# Patient Record
Sex: Female | Born: 1990 | Race: White | Hispanic: No | Marital: Single | Smoking: Never smoker
Health system: Southern US, Community
[De-identification: ages and names within clinical notes are randomized; demographics above are authoritative.]

## PROBLEM LIST (undated history)

## (undated) DIAGNOSIS — R569 Unspecified convulsions: Secondary | ICD-10-CM

---

## 2016-08-20 ENCOUNTER — Emergency Department (HOSPITAL_COMMUNITY)
Admission: EM | Admit: 2016-08-20 | Discharge: 2016-08-20 | Disposition: A | Discharge status: Home / Self Care | Payer: BLUE CROSS/BLUE SHIELD | Attending: Emergency Medicine | Admitting: Emergency Medicine

## 2016-08-20 ENCOUNTER — Encounter (HOSPITAL_COMMUNITY): Payer: Self-pay | Admitting: *Deleted

## 2016-08-20 DIAGNOSIS — R569 Unspecified convulsions: Secondary | ICD-10-CM

## 2016-08-20 DIAGNOSIS — Z79899 Other long term (current) drug therapy: Secondary | ICD-10-CM | POA: Diagnosis not present

## 2016-08-20 DIAGNOSIS — S01552A Open bite of oral cavity, initial encounter: Secondary | ICD-10-CM | POA: Diagnosis present

## 2016-08-20 DIAGNOSIS — Y999 Unspecified external cause status: Secondary | ICD-10-CM | POA: Insufficient documentation

## 2016-08-20 DIAGNOSIS — Y939 Activity, unspecified: Secondary | ICD-10-CM | POA: Diagnosis not present

## 2016-08-20 DIAGNOSIS — X58XXXA Exposure to other specified factors, initial encounter: Secondary | ICD-10-CM | POA: Insufficient documentation

## 2016-08-20 DIAGNOSIS — S01502A Unspecified open wound of oral cavity, initial encounter: Secondary | ICD-10-CM | POA: Insufficient documentation

## 2016-08-20 DIAGNOSIS — G40909 Epilepsy, unspecified, not intractable, without status epilepticus: Secondary | ICD-10-CM | POA: Insufficient documentation

## 2016-08-20 DIAGNOSIS — Y929 Unspecified place or not applicable: Secondary | ICD-10-CM | POA: Diagnosis not present

## 2016-08-20 HISTORY — DX: Unspecified convulsions: R56.9

## 2016-08-20 LAB — BASIC METABOLIC PANEL
Anion gap: 6 (ref 5–15)
BUN: 5 mg/dL — AB (ref 6–20)
CHLORIDE: 106 mmol/L (ref 101–111)
CO2: 25 mmol/L (ref 22–32)
Calcium: 9.1 mg/dL (ref 8.9–10.3)
Creatinine, Ser: 0.71 mg/dL (ref 0.44–1.00)
GFR calc Af Amer: >60 mL/min (ref >60)
GFR calc non Af Amer: >60 mL/min (ref >60)
Glucose, Bld: 92 mg/dL (ref 65–99)
POTASSIUM: 3.9 mmol/L (ref 3.5–5.1)
SODIUM: 137 mmol/L (ref 135–145)

## 2016-08-20 LAB — CBC WITH DIFFERENTIAL/PLATELET
Basophils Absolute: 0 10*3/uL (ref 0.0–0.1)
Basophils Relative: 0 %
Eosinophils Absolute: 0 10*3/uL (ref 0.0–0.7)
Eosinophils Relative: 1 %
HCT: 41.7 % (ref 36.0–46.0)
HEMOGLOBIN: 13.8 g/dL (ref 12.0–15.0)
LYMPHS ABS: 2.3 10*3/uL (ref 0.7–4.0)
LYMPHS PCT: 29 %
MCH: 31.5 pg (ref 26.0–34.0)
MCHC: 33.1 g/dL (ref 30.0–36.0)
MCV: 95.2 fL (ref 78.0–100.0)
MONOS PCT: 7 %
Monocytes Absolute: 0.5 10*3/uL (ref 0.1–1.0)
NEUTROS PCT: 63 %
Neutro Abs: 5.1 10*3/uL (ref 1.7–7.7)
Platelets: 273 10*3/uL (ref 150–400)
RBC: 4.38 MIL/uL (ref 3.87–5.11)
RDW: 13.1 % (ref 11.5–15.5)
WBC: 7.9 10*3/uL (ref 4.0–10.5)

## 2016-08-20 NOTE — ED Triage Notes (Signed)
Pt c/o feeling light headed after labs were drawn. Pt was reclined with feet elevated. Tolerating drinking ginger ale. Did not loose consciousness. Cool cloth applied to forehead

## 2016-08-20 NOTE — ED Triage Notes (Signed)
Per pt report: pt was talking on the phone to her mother this morning.  Over the phone, her mother heard to stop talking and only heard heavy breathing.  Pt was able to contact a friend who went called 911 and the fire dept arrived to pt's house.  Upon the fire dept's arrival to pt's home, pt answer the door but had no recollection of talking to her mother.  Pt hx of Absent Seizure and forgot to take her medications last night.  Tongue Injury noted and PERRLA.  Pt denies incontinence. Pt a/o x 4 and ambulatory in triage.

## 2016-08-20 NOTE — ED Triage Notes (Signed)
Pt sitting up in chair drinking clear liquids

## 2016-08-20 NOTE — ED Provider Notes (Signed)
[facility]-EMERGENCY DEPT Provider Note   CSN: [id number] Arrival date & time: 08/20/16  1213  By signing my name below, I, Christy SartoriusAnastasia Kolousek, attest that this documentation has been prepared under the direction and in the presence of  Arvilla MeresAshley Sherleen Pangborn, PA-C. Electronically Signed: Christy SartoriusAnastasia Kolousek, ED Scribe. 08/20/16. 2:01 PM.  History   Chief Complaint Chief Complaint  Patient presents with  . Seizures   The history is provided by the patient and medical records. No language interpreter was used.    HPI Comments:  Renee Dominguez is a 25 y.o. female with a hx of absence seizures who presents to the Emergency Department after unwitnessed seizure like activity at 1015 this morning.  She was lying on her bed talking to her mother on the phone when she suddently stopped talking and her mother only heard heavy breathing for about 8 minutes.  When the pt came too she didn't remember the episode and initially didn't recall her phone call with her mother but was able to relay her name, DOB and location.  After prompting the pt was able to remember details of the phone call.  While on the phone her mother called the fire department and one of pt's friends.Pt was able to greet them with a steady gate and normal coherent speech.  She has a bite on the right side of her tongue and states that she feels tired.  She is currently taking 300 mg Lamictal and 400 mg zonegran.  Last night she missed a dose of her medication; she states she doesn't typically miss doses.  Her last dose was at 1125 this morning, but she usually takes her medication at night.  Her mother adds that she's never had a [place] seizure and that this epsisode seemed to last longer than her typical seizures.  Pt reports that she hasn't had a seizure in a long time. She denies changes in diet and routine as well as EtOH consumption and recreational drug use.  Her mother notes that on Tuesday she had subjective fever and one episode of emesis; she  still felt under the weather yesterday.  Pt denies numbness, weakness, chest pain, SOB, changes in vision, difficulty swallowing, dysuria, hematuria, abdominal pain, recent vomiting, dizziness, lightheadedness and bowel or bladder incontinence.  Pt lives alone, but will stay with her mother for the next week.      Past Medical History:  Diagnosis Date  . Seizure (HCC)     There are no active problems to display for this patient.   History reviewed. No pertinent surgical history.  OB History    No data available       Home Medications    Prior to Admission medications   Medication Sig Start Date End Date Taking? Authorizing Provider  zonisamide (ZONEGRAN) 100 MG capsule Take 400 mg by mouth daily.   Yes Historical Provider, MD  lamoTRIgine (LAMICTAL) 100 MG tablet Take 300 mg by mouth daily.    Historical Provider, MD    Family History No family history on file.  Social History Social History  Substance Use Topics  . Smoking status: Never Smoker  . Smokeless tobacco: Never Used  . Alcohol use No     Allergies   Review of patient's allergies indicates no known allergies.   Review of Systems Review of Systems  Constitutional: Positive for fatigue and fever (subjective - resolved).  HENT: Negative for trouble swallowing.   Eyes: Negative for visual disturbance.  Respiratory: Negative for shortness of breath.  Cardiovascular: Negative for chest pain.  Gastrointestinal: Negative for abdominal pain, nausea and vomiting.  Genitourinary: Negative for dysuria and hematuria.  Musculoskeletal: Negative for arthralgias, myalgias and neck pain.  Skin: Positive for wound. Negative for rash.  Neurological: Positive for seizures. Negative for dizziness, facial asymmetry, speech difficulty, weakness, light-headedness, numbness and headaches.     Physical Exam Updated Vital Signs BP 106/68 (BP Location: Right Arm)   Pulse 83   Temp 98.3 F (36.8 C) (Oral)   Resp 16   Wt  63.5 kg   SpO2 100%   Physical Exam  Constitutional: She appears well-developed and well-nourished. No distress.  HENT:  Head: Normocephalic and atraumatic. Head is without raccoon's eyes and without Battle's sign.  Right Ear: No hemotympanum.  Left Ear: No hemotympanum.  Mouth/Throat: Oropharynx is clear and moist. Lacerations present. No oropharyngeal exudate.  Bite wound to right lateral tongue  Eyes: Conjunctivae and EOM are normal. Pupils are equal, round, and reactive to light. Right eye exhibits no discharge. Left eye exhibits no discharge. No scleral icterus.  Neck: Normal range of motion and phonation normal. Neck supple. No neck rigidity. Normal range of motion present.  Cardiovascular: Normal rate, regular rhythm, normal heart sounds and intact distal pulses.   No murmur heard. Pulmonary/Chest: Effort normal and breath sounds normal. No stridor. No respiratory distress. She has no wheezes. She has no rales.  Abdominal: Soft. Bowel sounds are normal. She exhibits no distension. There is no tenderness. There is no rigidity, no rebound, no guarding and no CVA tenderness.  Musculoskeletal: Normal range of motion.  Lymphadenopathy:    She has no cervical adenopathy.  Neurological: She is alert. She is not disoriented. Coordination and gait normal. GCS eye subscore is 4. GCS verbal subscore is 5. GCS motor subscore is 6.  Mental Status:  Alert, thought content appropriate, able to give a coherent history. Speech fluent without evidence of aphasia. Able to follow 2 step commands without difficulty.  Cranial Nerves:  II:  Peripheral visual fields grossly normal, pupils equal, round, reactive to light III,IV, VI: ptosis not present, extra-ocular motions intact bilaterally  V,VII: smile symmetric, facial light touch sensation equal VIII: hearing grossly normal to voice  X: uvula elevates symmetrically  XI: bilateral shoulder shrug symmetric and strong XII: midline tongue extension  without fassiculations Motor:  Normal tone. 5/5 in upper and lower extremities bilaterally including strong and equal grip strength and dorsiflexion/plantar flexion Sensory: light touch normal in all extremities. Cerebellar: normal finger-to-nose with bilateral upper extremities Gait: normal gait and balance CV: distal pulses palpable throughout   Skin: Skin is warm and dry. She is not diaphoretic.  Psychiatric: She has a normal mood and affect. Her behavior is normal.  Nursing note and vitals reviewed.    ED Treatments / Results   DIAGNOSTIC STUDIES:  Oxygen Saturation is 100% on RA, NML by my interpretation.    COORDINATION OF CARE:  2:01 PM Discussed treatment plan with pt at bedside and pt agreed to plan.  Labs (all labs ordered are listed, but only abnormal results are displayed) Labs Reviewed  BASIC METABOLIC PANEL - Abnormal; Notable for the following:       Result Value   BUN 5 (*)    All other components within normal limits  CBC WITH DIFFERENTIAL/PLATELET  LAMOTRIGINE LEVEL    EKG  EKG Interpretation None       Radiology No results found.  Procedures Procedures (including critical care time)  Medications Ordered in  ED Medications - No data to display   Initial Impression / Assessment and Plan / ED Course  I have reviewed the triage vital signs and the nursing notes.  Pertinent labs & imaging results that were available during my care of the patient were reviewed by me and considered in my medical decision making (see chart for details).  Clinical Course    Patient presents to ED with complaint of seizure. Patient is afebrile and non-toxic appearing in NAD. VSS. Pt has history of seizures. Seizure unwitnessed. Patient does not believe she hit her head. No battle sign, raccoon eyes, or hemotympanum. Bite wound to right lateral tongue. Patient with no evidence of focal neuro deficits on physical exam and is at mental baseline.  Labs reviewed and  re-assuring.  Lamictal pending. Pt history of seizures and missed medication dose last night, do not feel imaging is warranted that this time. Patient is advised to followup with neurologist in regards to today's event. Pt to stay with mother until re-evaluated by neurologist. Stressed importance of taking medication. Patient verbalizes understanding.  Return precautions given. Pt voiced understanding and is agreeable.    Final Clinical Impressions(s) / ED Diagnoses   Final diagnoses:  Seizure [facility])    New Prescriptions New Prescriptions   No medications on file   I personally performed the services described in this documentation, which was scribed in my presence. The recorded information has been reviewed and is accurate.     Lona Kettle, PA-C 08/20/16 1445    Arby Barrette, MD 08/23/16 [phone]

## 2016-08-20 NOTE — Discharge Instructions (Signed)
Read the information below.  Your basic labs were re-assuring. The Lamictal level will not result today. Please do not stay by yourself until re-evaluated by your neurologist. Please take your medications as prescribed. Please call your neurologist tomorrow (Monday) morning.  You may return to the Emergency Department at any time for worsening condition or any new symptoms that concern you.

## 2016-08-22 LAB — LAMOTRIGINE LEVEL: Lamotrigine Lvl: 2.9 ug/mL (ref 2.0–20.0)

## 2017-01-15 ENCOUNTER — Ambulatory Visit: Payer: BLUE CROSS/BLUE SHIELD | Admitting: Neurology

## 2017-01-15 ENCOUNTER — Telehealth: Payer: Self-pay

## 2017-01-15 NOTE — Telephone Encounter (Signed)
Pt no-showed her new pt appt this morning. 

## 2017-01-16 ENCOUNTER — Encounter: Payer: Self-pay | Admitting: Neurology

## 2017-06-07 ENCOUNTER — Encounter (HOSPITAL_COMMUNITY): Payer: Self-pay | Admitting: Emergency Medicine

## 2017-06-07 ENCOUNTER — Emergency Department (HOSPITAL_COMMUNITY): Payer: BLUE CROSS/BLUE SHIELD

## 2017-06-07 ENCOUNTER — Emergency Department (HOSPITAL_COMMUNITY)
Admission: EM | Admit: 2017-06-07 | Discharge: 2017-06-07 | Disposition: A | Discharge status: Home / Self Care | Payer: BLUE CROSS/BLUE SHIELD | Attending: Emergency Medicine | Admitting: Emergency Medicine

## 2017-06-07 DIAGNOSIS — Z79899 Other long term (current) drug therapy: Secondary | ICD-10-CM | POA: Insufficient documentation

## 2017-06-07 DIAGNOSIS — S80211A Abrasion, right knee, initial encounter: Secondary | ICD-10-CM | POA: Insufficient documentation

## 2017-06-07 DIAGNOSIS — Y93K1 Activity, walking an animal: Secondary | ICD-10-CM | POA: Diagnosis not present

## 2017-06-07 DIAGNOSIS — S80212A Abrasion, left knee, initial encounter: Secondary | ICD-10-CM | POA: Insufficient documentation

## 2017-06-07 DIAGNOSIS — S89391A Other physeal fracture of lower end of right fibula, initial encounter for closed fracture: Secondary | ICD-10-CM | POA: Diagnosis not present

## 2017-06-07 DIAGNOSIS — W109XXA Fall (on) (from) unspecified stairs and steps, initial encounter: Secondary | ICD-10-CM | POA: Diagnosis not present

## 2017-06-07 DIAGNOSIS — Y999 Unspecified external cause status: Secondary | ICD-10-CM | POA: Insufficient documentation

## 2017-06-07 DIAGNOSIS — W19XXXA Unspecified fall, initial encounter: Secondary | ICD-10-CM

## 2017-06-07 DIAGNOSIS — Y929 Unspecified place or not applicable: Secondary | ICD-10-CM | POA: Diagnosis not present

## 2017-06-07 DIAGNOSIS — S8991XA Unspecified injury of right lower leg, initial encounter: Secondary | ICD-10-CM | POA: Diagnosis present

## 2017-06-07 DIAGNOSIS — S82831A Other fracture of upper and lower end of right fibula, initial encounter for closed fracture: Secondary | ICD-10-CM

## 2017-06-07 DIAGNOSIS — M25571 Pain in right ankle and joints of right foot: Secondary | ICD-10-CM

## 2017-06-07 MED ORDER — IBUPROFEN 800 MG PO TABS: 800.0000 mg | ORAL_TABLET | Freq: Three times a day (TID) | ORAL | 0 refills | Status: AC

## 2017-06-07 MED ORDER — HYDROCODONE-ACETAMINOPHEN 5-325 MG PO TABS: 1.0000 | ORAL_TABLET | Freq: Four times a day (QID) | ORAL | 0 refills | Status: AC | PRN

## 2017-06-07 NOTE — ED Provider Notes (Signed)
[facility]-EMERGENCY DEPT Provider Note   CSN: [id number] Arrival date & time: 06/07/17  1636     History   Chief Complaint Chief Complaint  Patient presents with  . Ankle Injury    HPI Renee Dominguez is a 26 y.o. female with history of seizures who presents with right ankle pain after falling down some stairs. Patient reports she went out to walk her dog in her dog pulled her down the stairs. Patient twisted her right ankle. She fell on her bilateral knees. She has mild scrapes to bilateral knees, but only complains of pain in her right lateral ankle. She used ice prior to arrival. She is beginning to have some tingling to her foot. She denies any other injuries. She did not hit her head or lose consciousness. She did not take any medications prior to arrival.  HPI  Past Medical History:  Diagnosis Date  . Seizure (HCC)     There are no active problems to display for this patient.   History reviewed. No pertinent surgical history.  OB History    No data available       Home Medications    Prior to Admission medications   Medication Sig Start Date End Date Taking? Authorizing Provider  HYDROcodone-acetaminophen (NORCO/VICODIN) 5-325 MG tablet Take 1-2 tablets by mouth every 6 (six) hours as needed for severe pain. 06/07/17   Hermie Reagor, Waylan Boga, PA-C  ibuprofen (ADVIL,MOTRIN) 800 MG tablet Take 1 tablet (800 mg total) by mouth 3 (three) times daily. 06/07/17   Marlin Brys, Waylan Boga, PA-C  lamoTRIgine (LAMICTAL) 100 MG tablet Take 300 mg by mouth daily.    [provider]  zonisamide (ZONEGRAN) 100 MG capsule Take 400 mg by mouth daily.    [provider]    Family History No family history on file.  Social History Social History  Substance Use Topics  . Smoking status: Never Smoker  . Smokeless tobacco: Never Used  . Alcohol use No     Allergies   Patient has no known allergies.   Review of Systems Review of Systems  Musculoskeletal: Positive for  arthralgias and joint swelling.  Skin: Positive for wound.  Neurological: Negative for numbness.     Physical Exam Updated Vital Signs BP 111/71   Pulse (!) 51   Temp 99.5 F (37.5 C) (Oral)   Resp 18   SpO2 100%   Physical Exam  Constitutional: She appears well-developed and well-nourished. No distress.  HENT:  Head: Normocephalic and atraumatic.  Mouth/Throat: Oropharynx is clear and moist. No oropharyngeal exudate.  Eyes: Pupils are equal, round, and reactive to light. Conjunctivae are normal. Right eye exhibits no discharge. Left eye exhibits no discharge. No scleral icterus.  Neck: Normal range of motion. Neck supple. No thyromegaly present.  Cardiovascular: Normal rate, regular rhythm, normal heart sounds and intact distal pulses.  Exam reveals no gallop and no friction rub.   No murmur heard. Pulmonary/Chest: Effort normal and breath sounds normal. No stridor. No respiratory distress. She has no wheezes. She has no rales.  Musculoskeletal: She exhibits no edema.       Right ankle: She exhibits decreased range of motion and swelling. She exhibits normal pulse. Tenderness. Lateral malleolus and proximal fibula (mild) tenderness found. No medial malleolus tenderness found. Achilles tendon normal.  Significant edema and tenderness over right lateral malleolus; normal sensation to the foot; DP pulses intact; range of motion is limited due to pain, however patient can range with plantar and dorsiflexion; cap  refill < 2 secs  Lymphadenopathy:    She has no cervical adenopathy.  Neurological: She is alert. Coordination normal.  Skin: Skin is warm and dry. No rash noted. She is not diaphoretic. No pallor.  Psychiatric: She has a normal mood and affect.  Nursing note and vitals reviewed.    ED Treatments / Results  Labs (all labs ordered are listed, but only abnormal results are displayed) Labs Reviewed - No data to display  EKG  EKG Interpretation None        Radiology Dg Tibia/fibula Right  Result Date: 06/07/2017 CLINICAL DATA:  Pain after fall. EXAM: RIGHT TIBIA AND FIBULA - 2 VIEW COMPARISON:  None. FINDINGS: There is a mildly displaced distal fibular fracture best seen on the lateral view. No other fractures identified. IMPRESSION: Distal fibular fracture as above. Electronically Signed   By: Gerome Samavid  Williams III M.D   On: 06/07/2017 20:01   Dg Ankle Complete Right  Result Date: 06/07/2017 CLINICAL DATA:  Fall outside today down concrete stairs. Right lateral ankle pain and swelling. EXAM: RIGHT ANKLE - COMPLETE 3+ VIEW COMPARISON:  None. FINDINGS: Oblique distal fibular fracture at the level of the ankle mortise. Minimal displacement, no significant angulation. Ankle mortise is preserved. No additional acute fracture. There is lateral soft tissue edema at the fracture site. IMPRESSION: Oblique minimally displaced distal fibular fracture at the level of the ankle mortise. Electronically Signed   By: Rubye OaksMelanie  Ehinger M.D.   On: 06/07/2017 19:03    Procedures Procedures (including critical care time)  Medications Ordered in ED Medications - No data to display   Initial Impression / Assessment and Plan / ED Course  I have reviewed the triage vital signs and the nursing notes.  Pertinent labs & imaging results that were available during my care of the patient were reviewed by me and considered in my medical decision making (see chart for details).     Patient with oblique minimally displaced distal fibular fracture at the level of the ankle mortise on the right ankle x-ray. Tib-fib shows above fracture, but no other findings. I discussed patient case with Dr. Eulah PontMurphy who will see the patient in the morning. Patient placed in posterior splint and given crutches for nonweightbearing status. Discharge patient home with ibuprofen and short course of Norco for pain control. I reviewed the [place] narcotic database and found no discrepancies. Return  precautions discussed. Patient understands and agrees with plan. Patient vitals stable throughout ED course and discharged in satisfactory condition. I discussed patient case with Dr. Rush Landmarkegeler who guided the patient's management and agrees with plan.   Final Clinical Impressions(s) / ED Diagnoses   Final diagnoses:  Fall  Right ankle pain  Other closed fracture of distal end of right fibula, initial encounter    New Prescriptions Discharge Medication List as of 06/07/2017  9:22 PM    START taking these medications   Details  HYDROcodone-acetaminophen (NORCO/VICODIN) 5-325 MG tablet Take 1-2 tablets by mouth every 6 (six) hours as needed for severe pain., Starting Thu 06/07/2017, Print    ibuprofen (ADVIL,MOTRIN) 800 MG tablet Take 1 tablet (800 mg total) by mouth 3 (three) times daily., Starting Thu 06/07/2017, [address]         Othar Curto, Keeler FarmAlexandra M, PA-C 06/08/17 0205    Tegeler, Canary Brimhristopher J, MD 06/08/17 [phone]

## 2017-06-07 NOTE — ED Triage Notes (Signed)
Pt complaint of right ankle pain/swelling post dog pulling her down.

## 2017-06-07 NOTE — Discharge Instructions (Signed)
Medications: Norco, ibuprofen  Treatment: Take ibuprofen every 8 hours for your pain. For severe pain, take 1-2 Norco every 6 hours. Keep your splint on until your evaluated by Dr. Eulah PontMurphy. Keep it clean and dry. Keep you leg elevated to help with swelling and pain.  Do not drink alcohol, drive, operate machinery or participate in any other potentially dangerous activities while taking opiate pain medication as it may make you sleepy. Do not take this medication with any other sedating medications, either prescription or over-the-counter. If you were prescribed Percocet or Vicodin, do not take these with acetaminophen (Tylenol) as it is already contained within these medications and overdose of Tylenol is dangerous.   This medication is an opiate (or narcotic) pain medication and can be habit forming.  Use it as little as possible to achieve adequate pain control.  Do not use or use it with extreme caution if you have a history of opiate abuse or dependence. This medication is intended for your use only - do not give any to anyone else and keep it in a secure place where nobody else, especially children, have access to it. It will also cause or worsen constipation, so you may want to consider taking an over-the-counter stool softener while you are taking this medication.   Follow-up: Please go to Dr. Greig RightMurphy's office at 8:30 AM tomorrow for further evaluation and treatment of your ankle fracture. Please return to emergency department if you develop any new or worsening symptoms.

## 2019-05-10 IMAGING — CR DG ANKLE COMPLETE 3+V*R*
3 series · 3 of 3 positions shown · non-contrast
Comparison: None.

CLINICAL DATA: Fall outside today down concrete stairs. Right
lateral ankle pain and swelling.

EXAM:
RIGHT ANKLE - COMPLETE 3+ VIEW

[x ankle ap right]
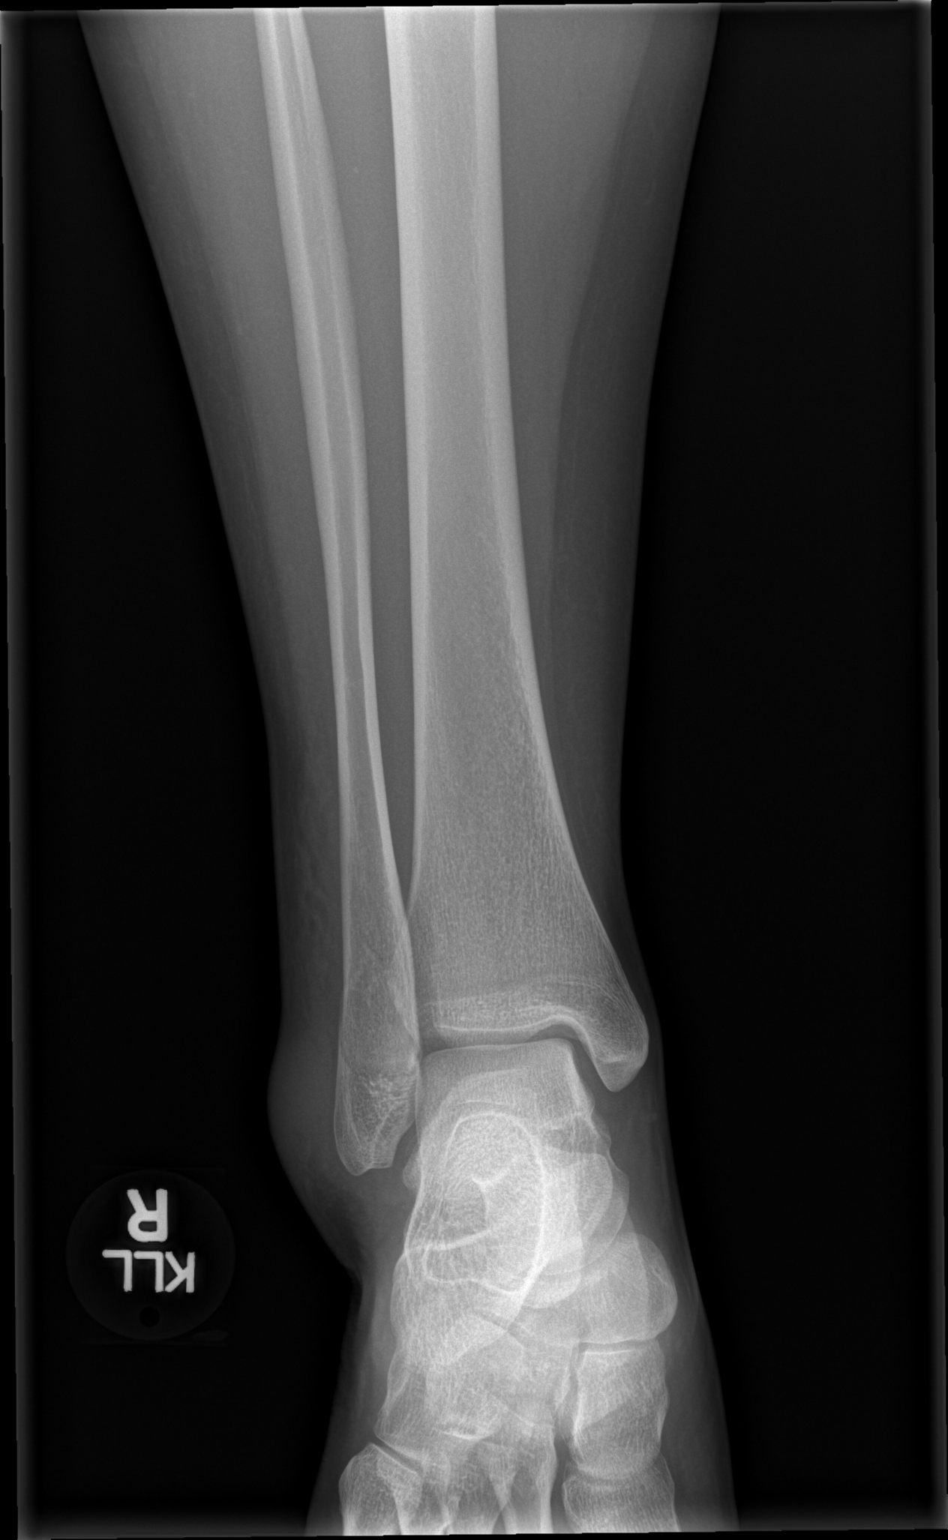

[x ankle obl right]
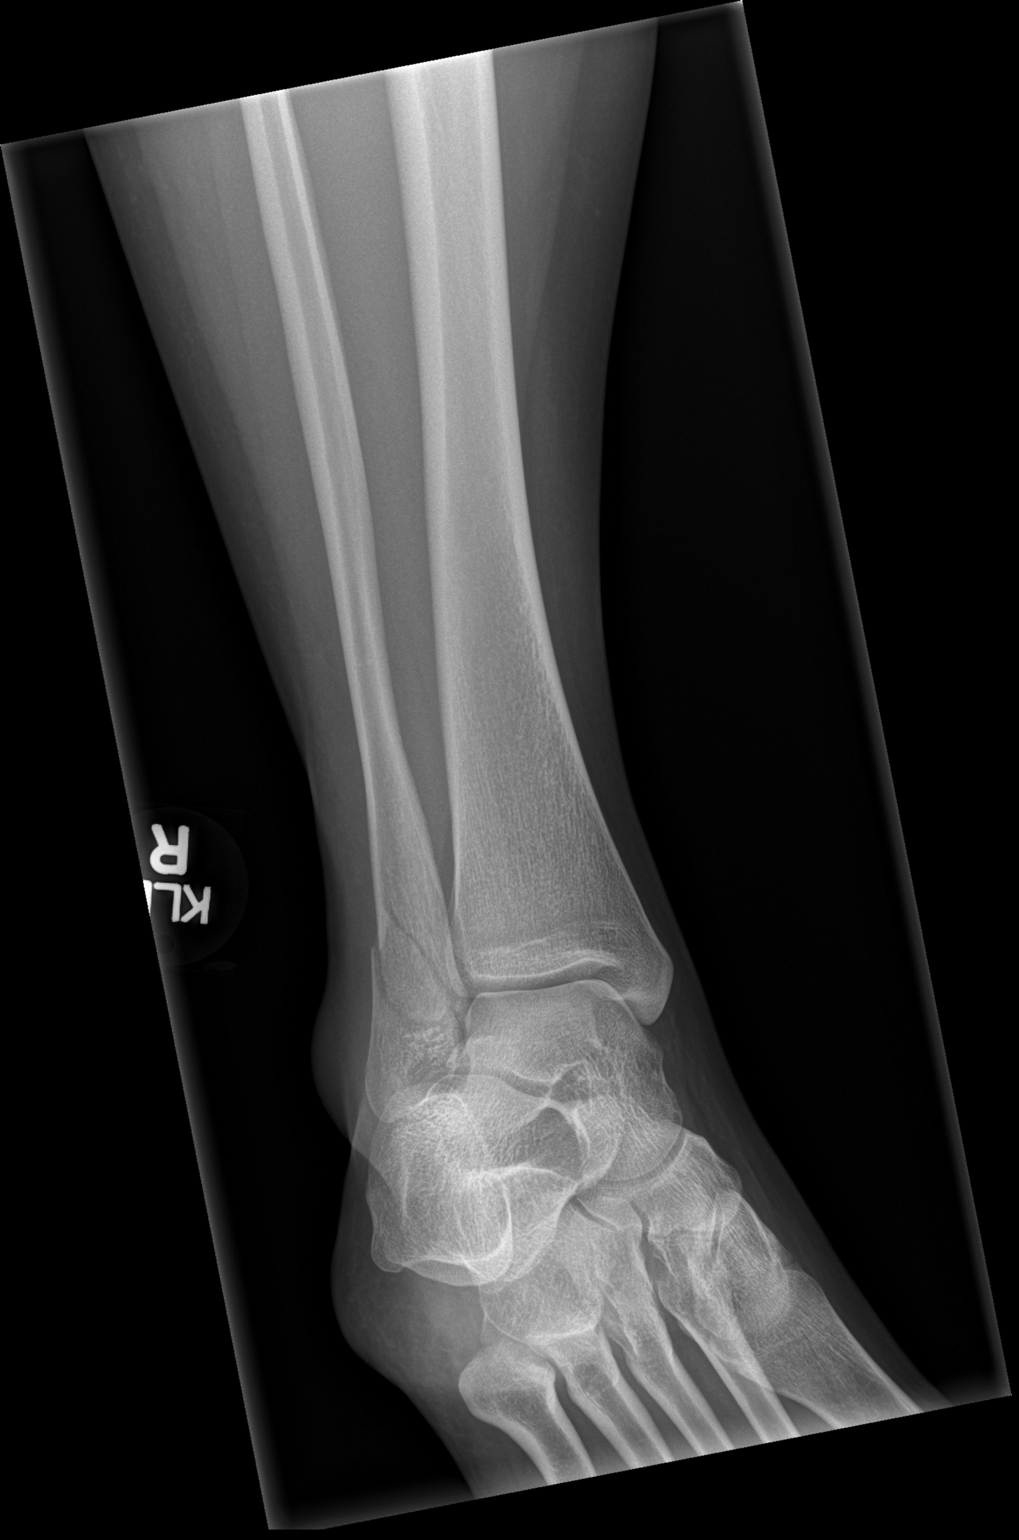

[x ankle lat right]
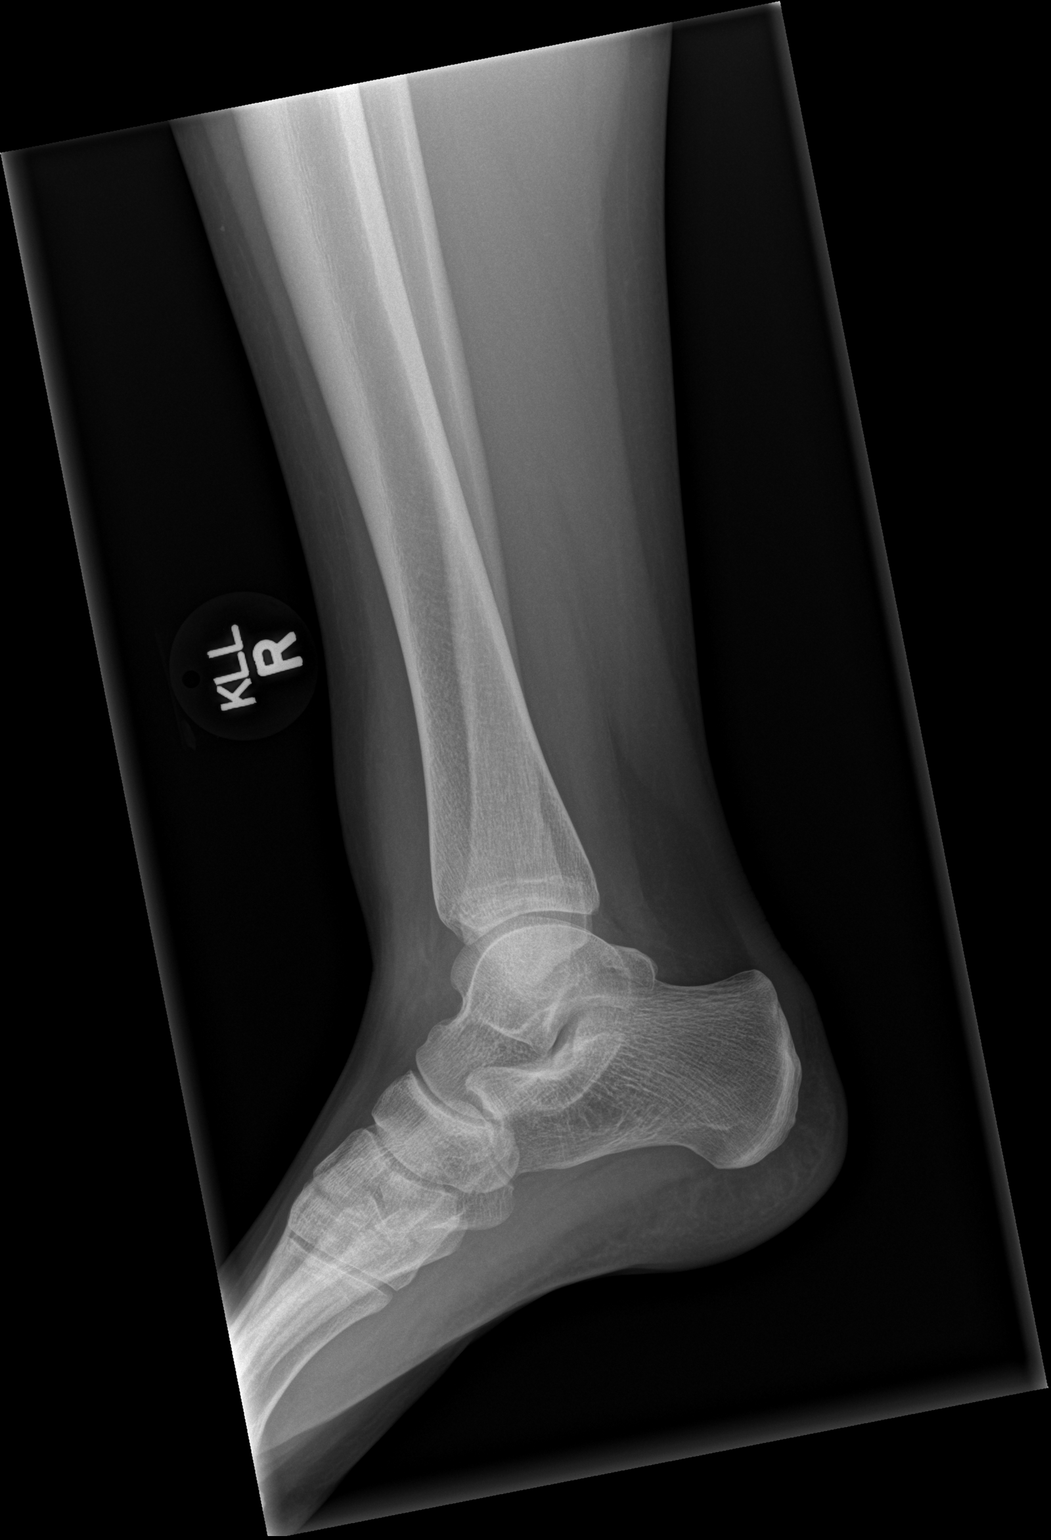

[3 of 3 positions shown; findings below may reference images not displayed]

FINDINGS: Oblique distal fibular fracture at the level of the ankle mortise.
Minimal displacement, no significant angulation. Ankle mortise is
preserved. No additional acute fracture. There is lateral soft
tissue edema at the fracture site.
IMPRESSION: Oblique minimally displaced distal fibular fracture at the level of
the ankle mortise.
